# Patient Record
Sex: Male | Born: 1995 | Race: Black or African American | Hispanic: No | Marital: Single | Smoking: Current every day smoker
Health system: Southern US, Community
[De-identification: ages and names within clinical notes are randomized; demographics above are authoritative.]

---
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
## 2018-01-12 ENCOUNTER — Encounter (HOSPITAL_COMMUNITY): Payer: Self-pay

## 2018-01-12 ENCOUNTER — Emergency Department (HOSPITAL_COMMUNITY): Payer: Medicaid Other

## 2018-01-12 ENCOUNTER — Emergency Department (HOSPITAL_COMMUNITY)
Admission: EM | Admit: 2018-01-12 | Discharge: 2018-01-12 | Disposition: A | Discharge status: Home / Self Care | Payer: Medicaid Other | Attending: Emergency Medicine | Admitting: Emergency Medicine

## 2018-01-12 DIAGNOSIS — J181 Lobar pneumonia, unspecified organism: Secondary | ICD-10-CM | POA: Diagnosis not present

## 2018-01-12 DIAGNOSIS — F17200 Nicotine dependence, unspecified, uncomplicated: Secondary | ICD-10-CM | POA: Diagnosis not present

## 2018-01-12 DIAGNOSIS — J189 Pneumonia, unspecified organism: Secondary | ICD-10-CM

## 2018-01-12 DIAGNOSIS — R1031 Right lower quadrant pain: Secondary | ICD-10-CM | POA: Insufficient documentation

## 2018-01-12 DIAGNOSIS — N200 Calculus of kidney: Secondary | ICD-10-CM | POA: Diagnosis not present

## 2018-01-12 DIAGNOSIS — R079 Chest pain, unspecified: Secondary | ICD-10-CM | POA: Diagnosis present

## 2018-01-12 LAB — URINALYSIS, ROUTINE W REFLEX MICROSCOPIC
BILIRUBIN URINE: NEGATIVE
GLUCOSE, UA: NEGATIVE mg/dL
Hgb urine dipstick: NEGATIVE
Ketones, ur: 5 mg/dL — AB
Leukocytes, UA: NEGATIVE
Nitrite: NEGATIVE
PROTEIN: NEGATIVE mg/dL
SPECIFIC GRAVITY, URINE: 1.019 (ref 1.005–1.030)
pH: 7 (ref 5.0–8.0)

## 2018-01-12 LAB — COMPREHENSIVE METABOLIC PANEL
ALBUMIN: 3.8 g/dL (ref 3.5–5.0)
ALK PHOS: 49 U/L (ref 38–126)
ALT: 20 U/L (ref 17–63)
AST: 36 U/L (ref 15–41)
Anion gap: 9 (ref 5–15)
BUN: 8 mg/dL (ref 6–20)
CALCIUM: 8.9 mg/dL (ref 8.9–10.3)
CHLORIDE: 105 mmol/L (ref 101–111)
CO2: 23 mmol/L (ref 22–32)
CREATININE: 0.89 mg/dL (ref 0.61–1.24)
GFR calc Af Amer: >60 mL/min (ref >60)
GFR calc non Af Amer: >60 mL/min (ref >60)
GLUCOSE: 86 mg/dL (ref 65–99)
Potassium: 3.9 mmol/L (ref 3.5–5.1)
SODIUM: 137 mmol/L (ref 135–145)
Total Bilirubin: 0.9 mg/dL (ref 0.3–1.2)
Total Protein: 6.6 g/dL (ref 6.5–8.1)

## 2018-01-12 LAB — LIPASE, BLOOD: LIPASE: 18 U/L (ref 11–51)

## 2018-01-12 LAB — CBC
HCT: 39.5 % (ref 39.0–52.0)
Hemoglobin: 13 g/dL (ref 13.0–17.0)
MCH: 29.3 pg (ref 26.0–34.0)
MCHC: 32.9 g/dL (ref 30.0–36.0)
MCV: 89.2 fL (ref 78.0–100.0)
PLATELETS: 239 10*3/uL (ref 150–400)
RBC: 4.43 MIL/uL (ref 4.22–5.81)
RDW: 13.6 % (ref 11.5–15.5)
WBC: 5.4 10*3/uL (ref 4.0–10.5)

## 2018-01-12 LAB — I-STAT TROPONIN, ED: TROPONIN I, POC: 0 ng/mL (ref 0.00–0.08)

## 2018-01-12 MED ORDER — DOXYCYCLINE HYCLATE 100 MG PO CAPS: 100.0000 mg | ORAL_CAPSULE | Freq: Two times a day (BID) | ORAL | 0 refills | Status: AC

## 2018-01-12 MED ORDER — IBUPROFEN 400 MG PO TABS
600.0000 mg | ORAL_TABLET | Freq: Once | ORAL | Status: AC
  Administered 2018-01-12: 600 mg via ORAL
  Filled 2018-01-12: qty 1

## 2018-01-12 NOTE — ED Provider Notes (Signed)
[facility] EMERGENCY DEPARTMENT Provider Note   CSN: [id number] Arrival date & time: 01/12/18  1416     History   Chief Complaint Chief Complaint  Patient presents with  . Chest Pain  . Abdominal Pain    HPI Justin Cochran is a 22 y.o. male.  The history is provided by the patient.  Chest Pain   This is a new problem. The current episode started more than 2 days ago. The problem occurs daily. The problem has been gradually worsening. The pain is associated with an emotional upset, lifting and breathing. Pain location: midchest. The pain is mild. The quality of the pain is described as pressure-like. The pain radiates to the upper back. Duration of episode(s) is 4 days. Associated symptoms include abdominal pain (radiates to R flank), back pain and cough (has allergic rhinitis symptoms). Pertinent negatives include no diaphoresis, no exertional chest pressure, no fever, no headaches, no irregular heartbeat, no leg pain, no lower extremity edema, no nausea, no near-syncope, no palpitations, no shortness of breath and no vomiting.  Pertinent negatives for past medical history include no seizures.  Abdominal Pain   This is a new problem. Episode onset: 4d. Episode frequency: every few hours. The problem has been gradually worsening. The pain is associated with an unknown factor. The pain is located in the RLQ. The quality of the pain is cramping. Pertinent negatives include anorexia, fever, diarrhea, hematochezia, melena, nausea, vomiting, constipation, dysuria, hematuria, headaches and arthralgias. Nothing aggravates the symptoms. Nothing relieves the symptoms.     History reviewed. No pertinent past medical history.  There are no active problems to display for this patient.   History reviewed. No pertinent surgical history.     Home Medications    Prior to Admission medications   Medication Sig Start Date End Date Taking? Authorizing Provider  doxycycline  (VIBRAMYCIN) 100 MG capsule Take 1 capsule (100 mg total) by mouth 2 (two) times daily for 7 days. 01/12/18 01/19/18  Dwana Melena, DO    Family History No family history on file.  Social History Social History   Tobacco Use  . Smoking status: Current Every Day Smoker  . Smokeless tobacco: Never Used  Substance Use Topics  . Alcohol use: Yes    Frequency: Never  . Drug use: No     Allergies   Patient has no known allergies.   Review of Systems Review of Systems  Constitutional: Negative for appetite change, chills, diaphoresis and fever.  HENT: Positive for rhinorrhea. Negative for ear pain and sore throat.   Eyes: Negative for pain and visual disturbance.  Respiratory: Positive for cough (has allergic rhinitis symptoms). Negative for chest tightness and shortness of breath.   Cardiovascular: Positive for chest pain. Negative for palpitations, leg swelling and near-syncope.  Gastrointestinal: Positive for abdominal pain (radiates to R flank). Negative for abdominal distention, anorexia, blood in stool, constipation, diarrhea, hematochezia, melena, nausea and vomiting.  Genitourinary: Negative for discharge, dysuria, hematuria, penile pain, scrotal swelling, testicular pain and urgency.  Musculoskeletal: Positive for back pain. Negative for arthralgias.  Skin: Negative for color change and rash.  Neurological: Negative for seizures, syncope, light-headedness and headaches.  All other systems reviewed and are negative.    Physical Exam Updated Vital Signs BP (!) 148/102 (BP Location: Left Arm)   Pulse 73   Temp 98.7 F (37.1 C) (Oral)   Resp 16   Ht 5\' 9"  (1.753 m)   Wt 77.1 kg (170 lb)  SpO2 99%   BMI 25.10 kg/m   Physical Exam  Constitutional: He appears well-developed and well-nourished. He does not appear ill. No distress.  HENT:  Head: Normocephalic and atraumatic.  Eyes: Conjunctivae are normal.  Neck: Neck supple.  Cardiovascular: Normal rate, regular  rhythm, intact distal pulses and normal pulses.  No murmur heard. Pulses:      Radial pulses are 2+ on the right side, and 2+ on the left side.  Pulmonary/Chest: Effort normal and breath sounds normal. No respiratory distress. He has no decreased breath sounds. He has no wheezes. He has no rales.  Abdominal: Soft. There is tenderness in the right lower quadrant. There is no rigidity, no rebound, no guarding, no CVA tenderness, no tenderness at McBurney's point and negative Murphy's sign. No hernia.  Musculoskeletal: He exhibits no edema.       Right lower leg: He exhibits no tenderness and no edema.       Left lower leg: He exhibits no tenderness and no edema.  Neurological: He is alert.  Skin: Skin is warm and dry.  Psychiatric: He has a normal mood and affect.  Nursing note and vitals reviewed.    ED Treatments / Results  Labs (all labs ordered are listed, but only abnormal results are displayed) Labs Reviewed  URINALYSIS, ROUTINE W REFLEX MICROSCOPIC - Abnormal; Notable for the following components:      Result Value   Ketones, ur 5 (*)    All other components within normal limits  LIPASE, BLOOD  COMPREHENSIVE METABOLIC PANEL  CBC  I-STAT TROPONIN, ED    EKG  EKG Interpretation  Date/Time:  Sunday January 12 2018 14:31:01 EDT Ventricular Rate:  99 PR Interval:  130 QRS Duration: 86 QT Interval:  314 QTC Calculation: 402 R Axis:   90 Text Interpretation:  Normal sinus rhythm Rightward axis T wave abnormality, consider inferior ischemia Abnormal ECG suspect LVH as cause for repol abnormalities Confirmed by Marily MemosMesner, Jason [phone]([id number]) on 01/12/2018 5:12:04 PM       Radiology Dg Chest 2 View  Result Date: 01/12/2018 CLINICAL DATA:  22 y/o  M; chest pain. EXAM: CHEST - 2 VIEW COMPARISON:  None. FINDINGS: The heart size and mediastinal contours are within normal limits. Both lungs are clear. The visualized skeletal structures are unremarkable. IMPRESSION: No active cardiopulmonary  disease. Electronically Signed   By: Mitzi HansenLance  Furusawa-Stratton M.D.   On: 01/12/2018 18:26   Ct Renal Stone Study  Result Date: 01/12/2018 CLINICAL DATA:  Flank pain. EXAM: CT ABDOMEN AND PELVIS WITHOUT CONTRAST TECHNIQUE: Multidetector CT imaging of the abdomen and pelvis was performed following the standard protocol without IV contrast. COMPARISON:  None. FINDINGS: Lower chest: Mild infiltrate in the left lower lobe is consistent with pneumonia. The lung bases are otherwise normal. Hepatobiliary: No focal liver abnormality is seen. No gallstones, gallbladder wall thickening, or biliary dilatation. Pancreas: Unremarkable. No pancreatic ductal dilatation or surrounding inflammatory changes. Spleen: Normal in size without focal abnormality. Adrenals/Urinary Tract: 2 punctate stones are seen in the upper right kidney with the largest seen on coronal image 66 measuring 2 or 3 mm. No hydronephrosis or perinephric stranding. No ureteral stones are identified. There is a tiny calcification seen on coronal image 53 in the pelvis. This calcification appears to be within the prostate rather than the base of the bladder. Stomach/Bowel: Stomach is within normal limits. Appendix appears normal. No evidence of bowel wall thickening, distention, or inflammatory changes. Vascular/Lymphatic: No significant vascular findings are present. No  enlarged abdominal or pelvic lymph nodes. Reproductive: Prostate is unremarkable. Other: No abdominal wall hernia or abnormality. No abdominopelvic ascites. Musculoskeletal: No acute or significant osseous findings. IMPRESSION: 1. Mild left lower lobe pneumonia. 2. Two nonobstructive stones are seen in the right kidney. No hydronephrosis or ureteral stones identified. 3. There is a tiny calcification in the pelvis which is favored to be of the prostate rather than the base of the bladder. Electronically Signed   By: Gerome Sam III M.D   On: 01/12/2018 18:27    Procedures Procedures  (including critical care time)  Medications Ordered in ED Medications  ibuprofen (ADVIL,MOTRIN) tablet 600 mg (600 mg Oral Given 01/12/18 1803)     Initial Impression / Assessment and Plan / ED Course  I have reviewed the triage vital signs and the nursing notes.  Pertinent labs & imaging results that were available during my care of the patient were reviewed by me and considered in my medical decision making (see chart for details).     Patient is a 22 year old male presenting with 4 days of intermittent chest pain and right lower quadrant pain.  Chest pain appears atypical and is typically comes on with stressful situations or lifting heavy things.  He currently does not have any chest pain.  He denies any shortness of breath.  His abdominal pain is right lower quadrant and radiates to the right flank.  He states the pain comes on randomly and there are no exacerbating or relieving factors.  Pain will last anywhere from 10 minutes to 1 hour and comes on every 3 hours.  He states he currently does not have the pain.  On exam he has some chest wall tenderness to palpation.  Equal pulses peripherally.  Abdomen with tenderness to the right lower quadrant.  No CVA tenderness.  Patient's chest pain is worked up with EKG which shows T wave inversions especially in inferior and lateral leads.  There are also tall R waves likely consistent with LVH.  Spot troponin is negative.  Chest x-ray is unremarkable.  Abdominal pain worked up with abdominal labs as above including CBC, CMP, lipase which were all normal.  UA does not show any UTI.  Colicky abdominal pain sound like renal colic so CT stone study obtained.  He has 2 nonobstructing stones in the right kidney which is likely the source of his colicky pain.  Patient given ibuprofen for pain with improvement.  Incidentally on the CT scan it showed a left lower lobe pneumonia.  We will treat for With doxycycline.  Patient to follow-up with a PCP in the  next 3-5 days regarding his pneumonia and his kidney stones.  Patient instructed to drink plenty of water.  Return precautions given.  Final Clinical Impressions(s) / ED Diagnoses   Final diagnoses:  Nephrolithiasis  Community acquired pneumonia of left lower lobe of lung [facility])    ED Discharge Orders        Ordered    doxycycline (VIBRAMYCIN) 100 MG capsule  2 times daily     01/12/18 1907       Dwana Melena, DO 01/12/18 1910    Mesner, Barbara Cower, MD 01/12/18 1942

## 2018-01-12 NOTE — ED Triage Notes (Addendum)
Pt reports RLQ pain that radiates around to back since Thursday. Denies n/v. PT reports having intermittent cp as well. Endorses 1 episode of dysuria this morning.

## 2019-10-01 ENCOUNTER — Other Ambulatory Visit: Payer: Self-pay

## 2019-10-01 DIAGNOSIS — Z20822 Contact with and (suspected) exposure to covid-19: Secondary | ICD-10-CM

## 2019-10-04 LAB — NOVEL CORONAVIRUS, NAA: SARS-CoV-2, NAA: NOT DETECTED

## 2019-10-31 IMAGING — CT CT RENAL STONE PROTOCOL
2 of 4 series · 16 of 46 positions shown, 18 images · non-contrast
Comparison: None.

CLINICAL DATA: Flank pain.

EXAM:
CT ABDOMEN AND PELVIS WITHOUT CONTRAST
TECHNIQUE: Multidetector CT imaging of the abdomen and pelvis was performed
following the standard protocol without IV contrast.

[Series 3: ap without · axial · non-contrast · 0.77mm/px · z∈[+669,+1074]mm · 13 of 91 slices shown, 15 images]
[im 5/91  soft-tissue]
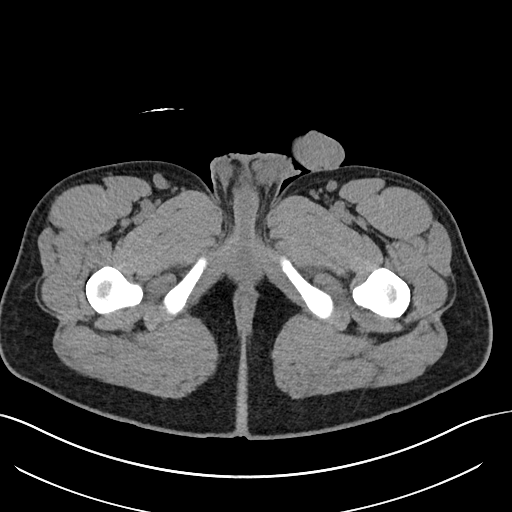
[im 5/91  bone]
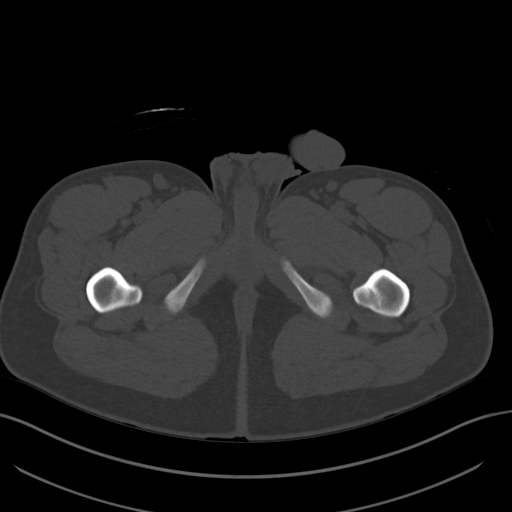
[im 14/91  soft-tissue]
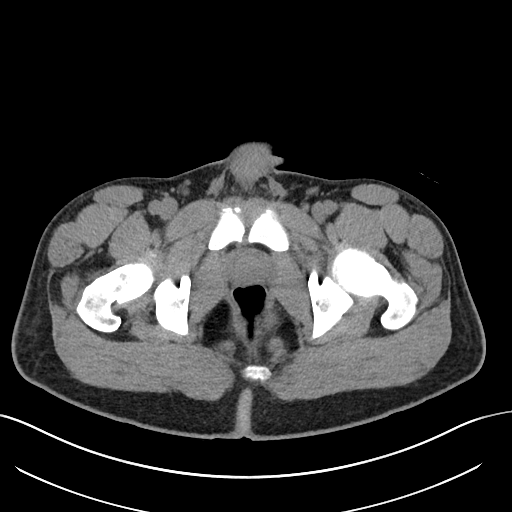
[im 19/91  soft-tissue]
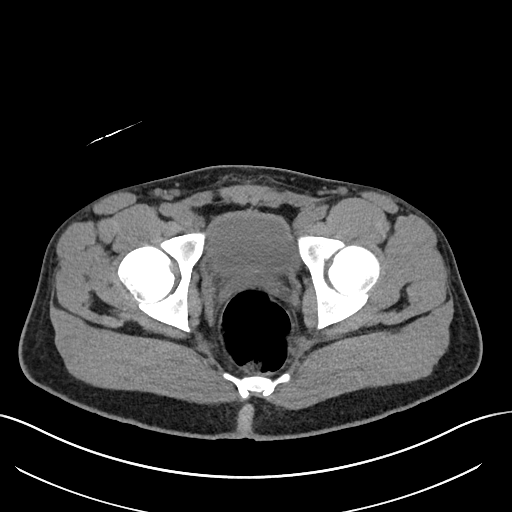
[im 28/91  soft-tissue]
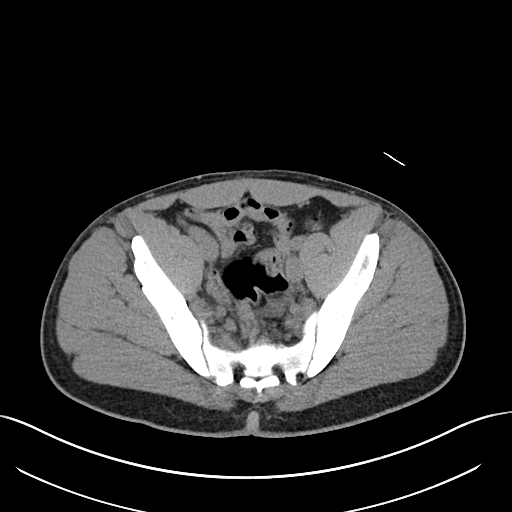
[im 32/91  soft-tissue]
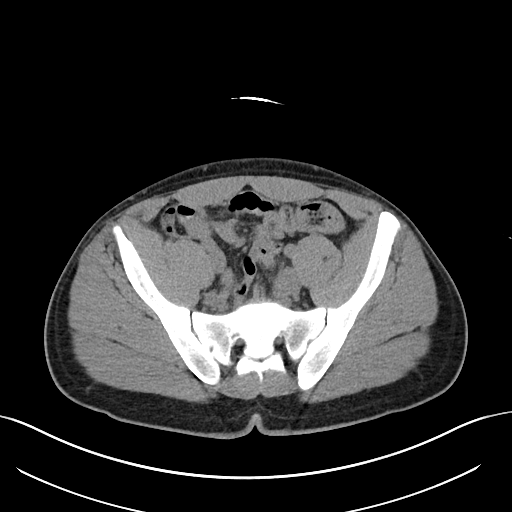
[im 41/91  soft-tissue]
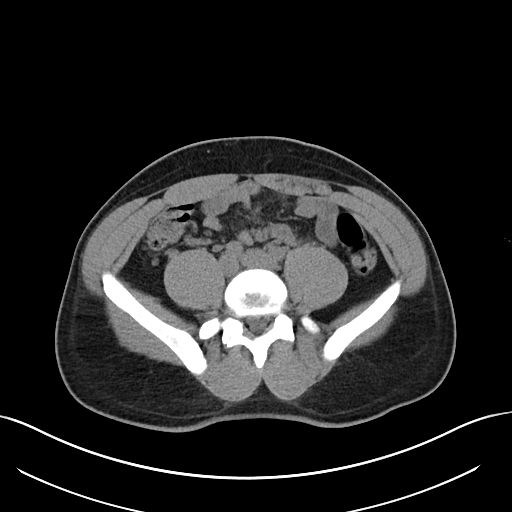
[im 46/91  soft-tissue]
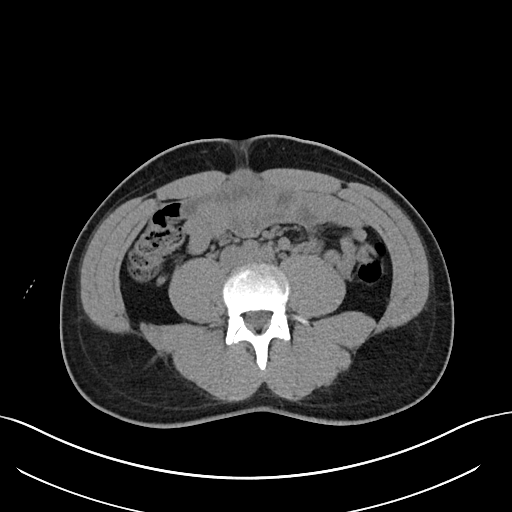
[im 50/91  soft-tissue]
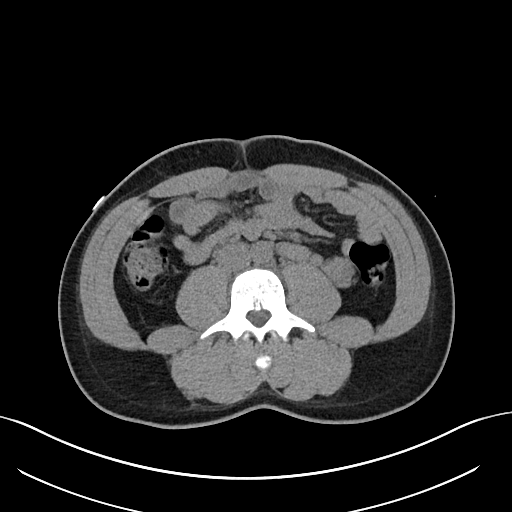
[im 59/91  soft-tissue]
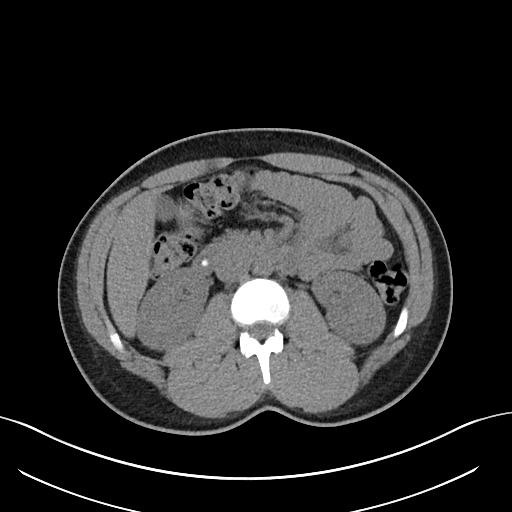
[im 59/91  bone]
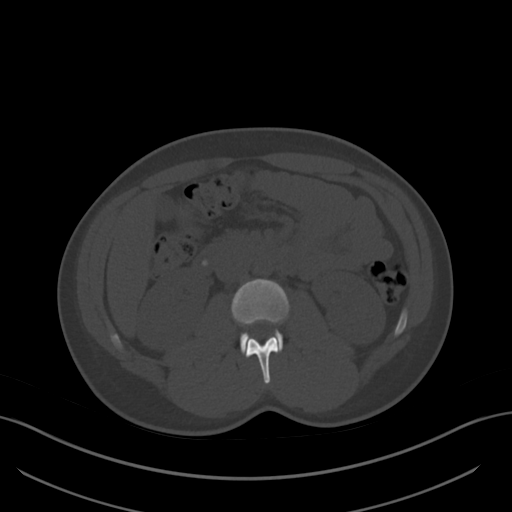
[im 64/91  soft-tissue]
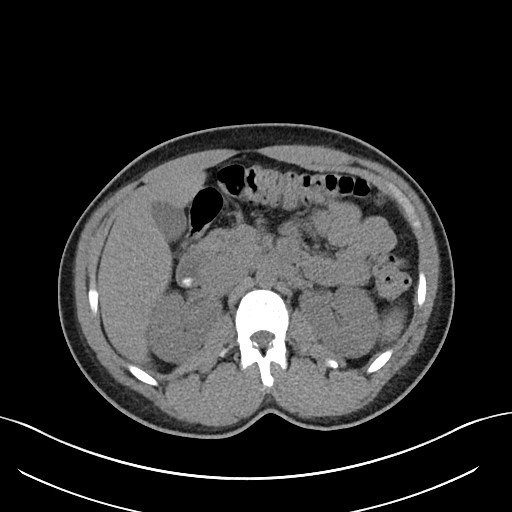
[im 73/91  soft-tissue]
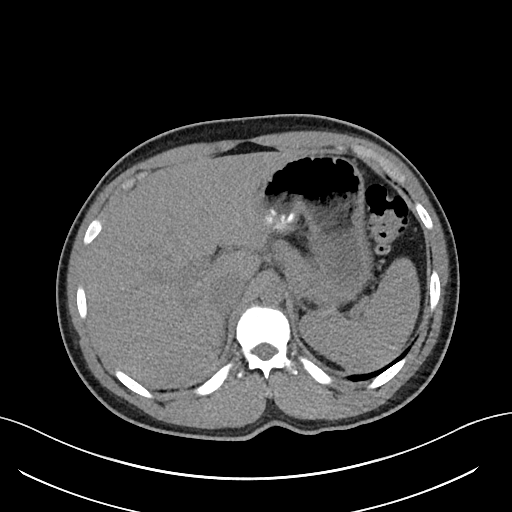
[im 77/91  soft-tissue]
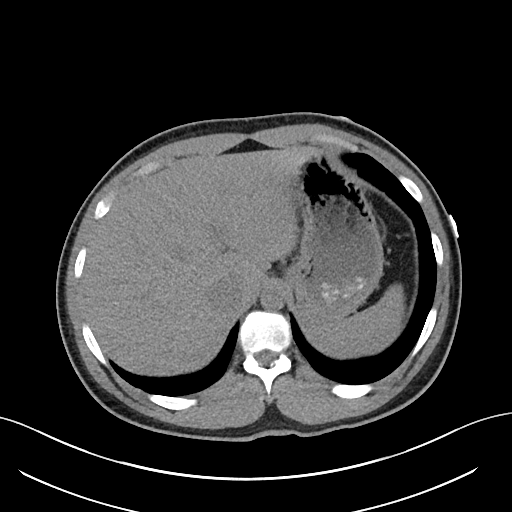
[im 86/91  soft-tissue]
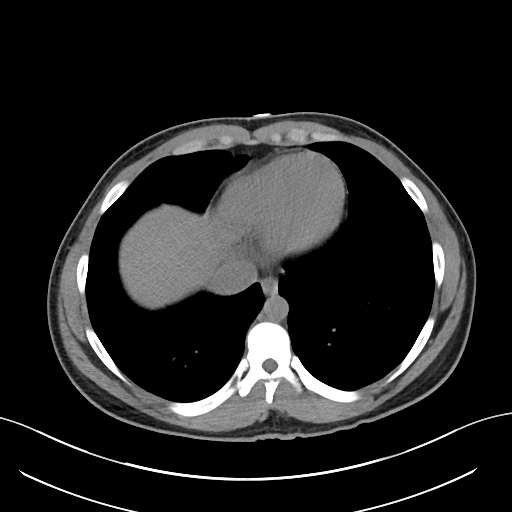

[Series 6: cor · coronal · 0.70mm/px · 3 of 101 slices shown]
[im 34/101  soft-tissue]
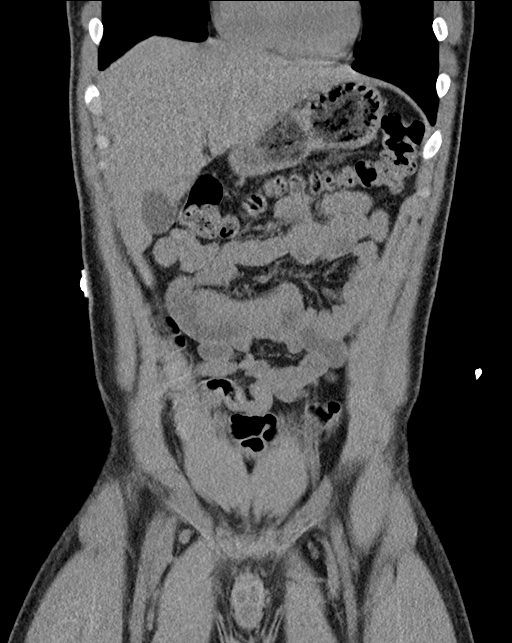
[im 45/101  soft-tissue]
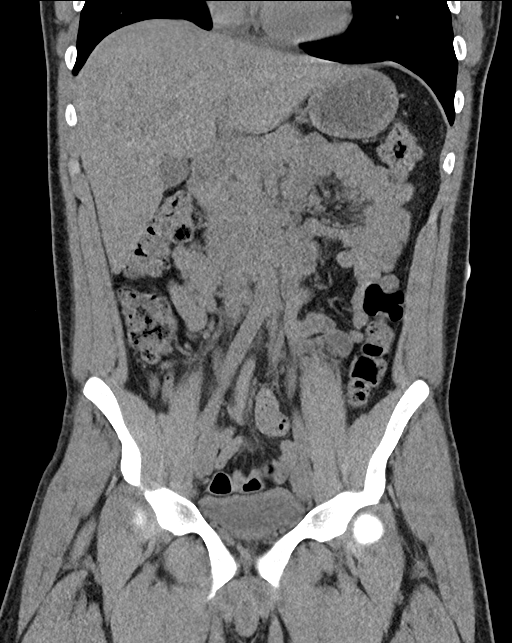
[im 56/101  soft-tissue]
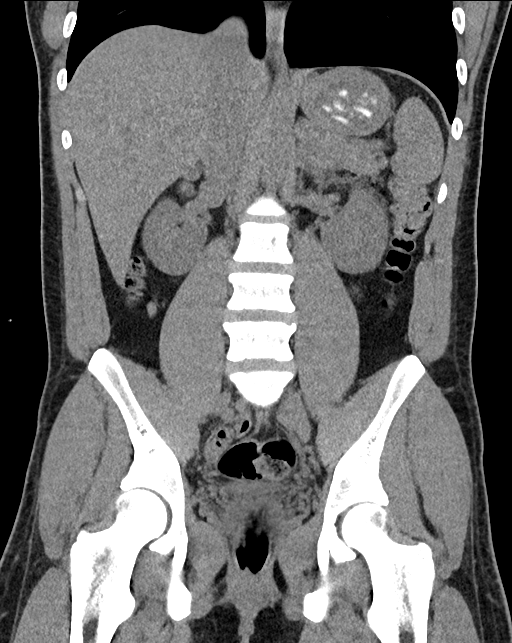

[16 of 46 positions shown; findings below may reference images not displayed]

FINDINGS: Lower chest: Mild infiltrate in the left lower lobe is consistent
with pneumonia. The lung bases are otherwise normal.

Hepatobiliary: No focal liver abnormality is seen. No gallstones,
gallbladder wall thickening, or biliary dilatation.

Pancreas: Unremarkable. No pancreatic ductal dilatation or
surrounding inflammatory changes.

Spleen: Normal in size without focal abnormality.

Adrenals/Urinary Tract: 2 punctate stones are seen in the upper
right kidney with the largest seen on coronal image 66 measuring 2
or 3 mm. No hydronephrosis or perinephric stranding. No ureteral
stones are identified. There is a tiny calcification seen on coronal
image 53 in the pelvis. This calcification appears to be within the
prostate rather than the base of the bladder.

Stomach/Bowel: Stomach is within normal limits. Appendix appears
normal. No evidence of bowel wall thickening, distention, or
inflammatory changes.

Vascular/Lymphatic: No significant vascular findings are present. No
enlarged abdominal or pelvic lymph nodes.

Reproductive: Prostate is unremarkable.

Other: No abdominal wall hernia or abnormality. No abdominopelvic
ascites.

Musculoskeletal: No acute or significant osseous findings.
IMPRESSION: 1. Mild left lower lobe pneumonia.
2. Two nonobstructive stones are seen in the right kidney. No
hydronephrosis or ureteral stones identified.
3. There is a tiny calcification in the pelvis which is favored to
be of the prostate rather than the base of the bladder.
